# Patient Record
Sex: Female | Born: 1965 | Race: White | Hispanic: No | Marital: Married | Smoking: Never smoker
Health system: Southern US, Community
[De-identification: ages and names within clinical notes are randomized; demographics above are authoritative.]

## PROBLEM LIST (undated history)

## (undated) DIAGNOSIS — F32A Depression, unspecified: Secondary | ICD-10-CM

## (undated) DIAGNOSIS — F329 Major depressive disorder, single episode, unspecified: Secondary | ICD-10-CM

## (undated) DIAGNOSIS — F419 Anxiety disorder, unspecified: Secondary | ICD-10-CM

## (undated) DIAGNOSIS — I10 Essential (primary) hypertension: Secondary | ICD-10-CM

## (undated) HISTORY — PX: OTHER SURGICAL HISTORY: SHX169

## (undated) HISTORY — PX: TONSILLECTOMY: SUR1361

---
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
## 2006-08-06 ENCOUNTER — Ambulatory Visit: Payer: Self-pay | Admitting: Endocrinology

## 2008-06-16 ENCOUNTER — Ambulatory Visit: Payer: Self-pay | Admitting: Family Medicine

## 2008-10-15 ENCOUNTER — Ambulatory Visit: Payer: Self-pay | Admitting: Gastroenterology

## 2010-01-24 ENCOUNTER — Ambulatory Visit: Payer: Self-pay | Admitting: Family Medicine

## 2010-09-05 ENCOUNTER — Ambulatory Visit: Payer: Self-pay | Admitting: Family Medicine

## 2010-09-06 ENCOUNTER — Inpatient Hospital Stay: Payer: Self-pay | Admitting: Internal Medicine

## 2010-09-14 ENCOUNTER — Ambulatory Visit: Payer: Self-pay | Admitting: Family Medicine

## 2010-12-20 ENCOUNTER — Ambulatory Visit: Payer: Self-pay | Admitting: Family Medicine

## 2012-01-16 ENCOUNTER — Emergency Department: Payer: Self-pay | Admitting: Emergency Medicine

## 2012-01-16 LAB — CBC
MCHC: 34.7 g/dL (ref 32.0–36.0)
RBC: 4.86 10*6/uL (ref 3.80–5.20)
RDW: 13.6 % (ref 11.5–14.5)
WBC: 10.1 10*3/uL (ref 3.6–11.0)

## 2012-01-16 LAB — BASIC METABOLIC PANEL
Anion Gap: 7 (ref 7–16)
BUN: 12 mg/dL (ref 7–18)
Calcium, Total: 8.9 mg/dL (ref 8.5–10.1)
Creatinine: 0.64 mg/dL (ref 0.60–1.30)
EGFR (African American): >60
EGFR (Non-African Amer.): >60
Glucose: 90 mg/dL (ref 65–99)
Osmolality: 279 (ref 275–301)
Potassium: 3.8 mmol/L (ref 3.5–5.1)

## 2012-01-16 LAB — LIPASE, BLOOD: Lipase: 65 U/L — ABNORMAL LOW (ref 73–393)

## 2012-01-16 LAB — CK TOTAL AND CKMB (NOT AT ARMC)
CK, Total: 85 U/L (ref 21–215)
CK-MB: 0.9 ng/mL (ref 0.5–3.6)

## 2013-07-28 ENCOUNTER — Ambulatory Visit: Payer: Self-pay | Admitting: Family Medicine

## 2013-08-11 ENCOUNTER — Ambulatory Visit: Payer: Self-pay | Admitting: Family Medicine

## 2014-05-01 ENCOUNTER — Emergency Department
Admit: 2014-05-01 | Disposition: A | Discharge status: Home / Self Care | Payer: Self-pay | Admitting: Emergency Medicine

## 2014-05-01 LAB — BASIC METABOLIC PANEL
Anion Gap: 5 — ABNORMAL LOW (ref 7–16)
BUN: 18 mg/dL
CREATININE: 0.8 mg/dL
Calcium, Total: 8.7 mg/dL — ABNORMAL LOW
Chloride: 106 mmol/L
Co2: 26 mmol/L
EGFR (African American): >60
Glucose: 102 mg/dL — ABNORMAL HIGH
POTASSIUM: 3.6 mmol/L
SODIUM: 137 mmol/L

## 2014-05-01 LAB — CBC WITH DIFFERENTIAL/PLATELET
BASOS PCT: 1 %
Basophil #: 0.1 10*3/uL (ref 0.0–0.1)
Eosinophil #: 0.1 10*3/uL (ref 0.0–0.7)
Eosinophil %: 1.5 %
HCT: 40.6 % (ref 35.0–47.0)
HGB: 13.3 g/dL (ref 12.0–16.0)
LYMPHS ABS: 1 10*3/uL (ref 1.0–3.6)
Lymphocyte %: 15.7 %
MCH: 28.7 pg (ref 26.0–34.0)
MCHC: 32.7 g/dL (ref 32.0–36.0)
MCV: 88 fL (ref 80–100)
MONO ABS: 0.5 x10 3/mm (ref 0.2–0.9)
MONOS PCT: 7 %
NEUTROS ABS: 4.8 10*3/uL (ref 1.4–6.5)
Neutrophil %: 74.8 %
Platelet: 253 10*3/uL (ref 150–440)
RBC: 4.63 10*6/uL (ref 3.80–5.20)
RDW: 13.7 % (ref 11.5–14.5)
WBC: 6.4 10*3/uL (ref 3.6–11.0)

## 2014-05-06 LAB — CULTURE, BLOOD (SINGLE)

## 2015-04-05 ENCOUNTER — Ambulatory Visit (INDEPENDENT_AMBULATORY_CARE_PROVIDER_SITE_OTHER): Payer: 59 | Admitting: Podiatry

## 2015-04-05 ENCOUNTER — Encounter: Payer: Self-pay | Admitting: Podiatry

## 2015-04-05 ENCOUNTER — Ambulatory Visit (INDEPENDENT_AMBULATORY_CARE_PROVIDER_SITE_OTHER): Payer: 59

## 2015-04-05 VITALS — BP 143/81 | HR 76 | Resp 18

## 2015-04-05 DIAGNOSIS — M722 Plantar fascial fibromatosis: Secondary | ICD-10-CM | POA: Diagnosis not present

## 2015-04-05 DIAGNOSIS — M19072 Primary osteoarthritis, left ankle and foot: Secondary | ICD-10-CM | POA: Diagnosis not present

## 2015-04-05 DIAGNOSIS — M7730 Calcaneal spur, unspecified foot: Secondary | ICD-10-CM | POA: Insufficient documentation

## 2015-04-05 DIAGNOSIS — M7732 Calcaneal spur, left foot: Secondary | ICD-10-CM | POA: Diagnosis not present

## 2015-04-05 DIAGNOSIS — L600 Ingrowing nail: Secondary | ICD-10-CM | POA: Diagnosis not present

## 2015-04-05 DIAGNOSIS — R52 Pain, unspecified: Secondary | ICD-10-CM

## 2015-04-05 DIAGNOSIS — M19079 Primary osteoarthritis, unspecified ankle and foot: Secondary | ICD-10-CM | POA: Insufficient documentation

## 2015-04-05 MED ORDER — MELOXICAM 15 MG PO TABS: 15.0000 mg | ORAL_TABLET | Freq: Every day | ORAL | Status: AC

## 2015-04-05 NOTE — Progress Notes (Signed)
   Subjective:    Patient ID: Erika Kirk, female    DOB: [birth year], 50 y.o.   MRN: [id number]  HPI  Patient presents to the office for concerns of left heel pain, on the bottom which has been ongoing on for about 2 weeks. It is worse in the morning when she first gets up. It is also bad if she sits down for some time and gets back up to walk. After she walks the pain does ease up. She also gets pain if she brings her foot up (shows me an ankle dorsiflexion motion). This started after wearing flat shoes. No tingling or numbness. No injury or trauma. No swelling or redness. No other complaints at this time.   Review of Systems  All other systems reviewed and are negative.      Objective:   Physical Exam General: AAO x3, NAD  Dermatological: Skin is warm, dry and supple bilateral.She speaks we have the right hallux toenail removed and she has small spicules the nail within both medial and lateral nail borders. No open lesions or pre-ulcer lesions identified this time.   Vascular: Dorsalis Pedis artery and Posterior Tibial artery pedal pulses are 2/4 bilateral with immedate capillary fill time. Pedal hair growth present. No varicosities and no lower extremity edema present bilateral. There is no pain with calf compression, swelling, warmth, erythema.   Neruologic: Grossly intact via light touch bilateral. Vibratory intact via tuning fork bilateral. Protective threshold with Semmes Wienstein monofilament intact to all pedal sites bilateral. Patellar and Achilles deep tendon reflexes 2+ bilateral. No Babinski or clonus noted bilateral.   Musculoskeletal: Tenderness to palpation along the plantar medial tubercle of the calcaneus at the insertion of plantar fascia on the left foot. There is no pain along the course of the plantar fascia within the arch of the foot. Plantar fascia appears to be intact. There is no pain with lateral compression of the calcaneus or pain with vibratory  sensation. There is no pain along the course or insertion of the achilles tendon. No other areas of tenderness to bilateral lower extremities. HAV and hammertoes are present.  Gait: Unassisted, Nonantalgic.      Assessment & Plan:  50 year old female left heel pain, likely plantar fasciitis, heel spur syndrome. -Treatment options discussed including all alternatives, risks, and complications -X-rays were obtained and reviewed with the patient. Female spurring is present. 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 -Etiology of symptoms were discussed -Patient elects to proceed with steroid injection into the left heel. Under sterile skin preparation, a total of 2.5cc of kenalog 10, 0.5% Marcaine plain, and 2% lidocaine plain were infiltrated into the symptomatic area without complication. A band-aid was applied. Patient tolerated the injection well without complication. Post-injection care with discussed with the patient. Discussed with the patient to ice the area over the next couple of days to help prevent a steroid flare.  -Prescribed mobic. Discussed side effects of the medication and directed to stop if any are to occur and call the office.  -Stretching exercises daily. -Ice to the area -Discussed supportive shoe gear. -Plantar fascial brace. -She also like to have the spicules the nail removed peripherally. Discussed AP nail procedure next appointment if she were to proceed with.  -Follow-up in 3 weeks or sooner if any problems arise. In the meantime, encouraged to call the office with any questions, concerns, change in symptoms.   Ovid CurdMatthew Wagoner, DPM

## 2015-04-05 NOTE — Patient Instructions (Signed)

## 2015-04-26 ENCOUNTER — Encounter: Payer: Self-pay | Admitting: Podiatry

## 2015-04-26 ENCOUNTER — Ambulatory Visit (INDEPENDENT_AMBULATORY_CARE_PROVIDER_SITE_OTHER): Payer: 59 | Admitting: Podiatry

## 2015-04-26 VITALS — BP 148/80 | HR 70 | Resp 18

## 2015-04-26 DIAGNOSIS — M722 Plantar fascial fibromatosis: Secondary | ICD-10-CM

## 2015-04-26 DIAGNOSIS — L600 Ingrowing nail: Secondary | ICD-10-CM | POA: Diagnosis not present

## 2015-04-26 NOTE — Progress Notes (Addendum)
Patient ID: Erika Kirk, female   DOB: Apr 21, 1965, 50 y.o.   MRN: [id number]  Subjective: 50 year old female presents the also for follow-up with vibration left heel pain. She says the injection did help some however the pain has come back. She has been stretching icing as much as she can. She also gets intermittent numbness to the foot and leg however not currently experiencing this.  She also had to proceed with partial nail avulsions of the right big toenail to remove the spicule the nail from the nail border. Denies any redness or drainage or any swelling.  Denies any systemic complaints such as fevers, chills, nausea, vomiting. No acute changes since last appointment, and no other complaints at this time.   Objective: AAO x3, NA; presents wearing flat sandals  DP/PT pulses palpable bilaterally, CRT less than 3 seconds Protective sensation intact with Simms Weinstein monofilament On the right hallux or spicules of nail present in both the medial and lateral nail border tenderness to palpation. There is no surrounding edema, erythema, drainage or pus. Tenderness to palpation along the plantar medial tubercle of the calcaneus at the insertion of plantar fascia on the left foot. There is no pain along the course of the plantar fascia within the arch of the foot. Plantar fascia appears to be intact. There is no pain with lateral compression of the calcaneus or pain with vibratory sensation. There is no pain along the course or insertion of the achilles tendon. No other areas of pinpoint bony tenderness or pain with vibratory sensation. MMT 5/5, ROM WNL. No edema, erythema, increase in warmth to bilateral lower extremities.  No open lesions or pre-ulcerative lesions.  No pain with calf compression, swelling, warmth, erythema  Assessment: Left foot plantar fasciitis, right foot ingrown toenail hallux  Plan: -All treatment options discussed with the patient including all alternatives,  risks, complications.  -In regards to the plantar fasciitis I discussed a second steroid injection she wishes to proceed. Sterile conditions a mixture of Kenalog 10 and lobe Lamisil it was infiltrated into the area of maximal tenderness without complications. Post injection care discussed. Also continues stretching, icing, anti-inflammatories. Discussed supportive shoe gear and custom orthotics. -At this time, the patient is requesting partial nail removal with chemical matricectomy to the symptomatic portion of the nail. Risks and complications were discussed with the patient for which they understand and  verbally consent to the procedure. Under sterile conditions a total of 3 mL of a mixture of 2% lidocaine plain and 0.5% Marcaine plain was infiltrated in a hallux block fashion. Once anesthetized, the skin was prepped in sterile fashion. A tourniquet was then applied. Next the medial and lateral aspect of hallux nail border was then sharply excised making sure to remove the entire offending nail border. Once the nails were ensured to be removed area was debrided and the underlying skin was intact. There is no purulence identified in the procedure. Next phenol was then applied under standard conditions and copiously irrigated. Silvadene was applied. A dry sterile dressing was applied. After application of the dressing the tourniquet was removed and there is found to be an immediate capillary refill time to the digit. The patient tolerated the procedure well any complications. Post procedure instructions were discussed the patient for which he verbally understood. Follow-up in 2 weeks for nail check or sooner if any problems are to arise. Discussed signs/symptoms of infection and directed to call the office immediately should any occur or go directly to the emergency  room. In the meantime, encouraged to call the office with any questions, concerns, changes symptoms. -Discussed etiologies of left foot and leg  numbness. She has no back problems. We'll consider graft conduction test of symptoms persist. She is currently staying these is intermittent in nature and currently on experiencing symptoms. Will monitor.  -Patient encouraged to call the office with any questions, concerns, change in symptoms.   Erika Kirk, DPM

## 2015-04-26 NOTE — Patient Instructions (Signed)

## 2015-05-03 ENCOUNTER — Telehealth: Payer: Self-pay | Admitting: *Deleted

## 2015-05-03 NOTE — Telephone Encounter (Signed)
Patient called and left a message on the nurse line today at 9:39 am and sees Dr Ardelle AntonWagoner and was to see Dr Ardelle AntonWagoner next week but was wanting to see if Dr Ardelle AntonWagoner would RX prednisone a week early and phone number is [phone]. Misty StanleyLisa

## 2015-05-03 NOTE — Telephone Encounter (Addendum)
Pt states Dr. Ardelle AntonWagoner and she had discuss the possibility of steroid dose pack at last visit, but decided to take an steroid injection.  Pt states the steroid injection is not working and she has an appt next week, but would like to know if she can have the steroid dose pack prior to the appt.  I told pt to continue the Mobic, until I called again, that I would message Dr. Ardelle AntonWagoner.  I encouraged pt to continue icing 10-15 minutes 3-4 times a day and to rest.  05/05/2015-left message for pt to call for orders from Dr. Ardelle AntonWagoner. Pt returned my call and states her toenail is perfect.  I told pt Dr. Ardelle AntonWagoner would order the steroid pack and not to take the Meloxicam until she had completed the steroid dose pack. Pt states understanding.

## 2015-05-04 NOTE — Telephone Encounter (Signed)
As long as the toenail is doing good can start a medrol dose pack 4mg 

## 2015-05-05 MED ORDER — METHYLPREDNISOLONE 4 MG PO TBPK: ORAL_TABLET | ORAL | Status: DC

## 2015-05-12 ENCOUNTER — Encounter: Payer: Self-pay | Admitting: Podiatry

## 2015-05-12 ENCOUNTER — Ambulatory Visit (INDEPENDENT_AMBULATORY_CARE_PROVIDER_SITE_OTHER): Payer: 59 | Admitting: Podiatry

## 2015-05-12 VITALS — BP 139/84 | HR 80 | Resp 18

## 2015-05-12 DIAGNOSIS — M722 Plantar fascial fibromatosis: Secondary | ICD-10-CM | POA: Diagnosis not present

## 2015-05-12 MED ORDER — METHYLPREDNISOLONE 4 MG PO TBPK: ORAL_TABLET | ORAL | Status: AC

## 2015-05-16 NOTE — Progress Notes (Signed)
Patient ID: Osie BondNancy Terveen Zinni, female   DOB: [birth year], 50 y.o.   MRN: [id number]  Subjective: 50 year old female presents the office in follow-up with vibration a left heel pain. She states that she still same painter heel. The pain may be somewhat improved however overall remains about the same. She wishes to any further steroid injection. She is asking to do another round of a Medrol Dosepak as she typically needs 14 days of this. She denies any swelling or redness. No tingling or numbness. She states of the right side is doing well she's having no issues of the toenail is healed. She does not wish to take off her shoes or socks on the right side. Denies any systemic complaints such as fevers, chills, nausea, vomiting. No acute changes since last appointment, and no other complaints at this time.   Objective: AAO x3, NAD DP/PT pulses palpable bilaterally, CRT less than 3 seconds Protective sensation intact with Simms Weinstein monofilament On the left foot there is continuation tenderness to palpation on the plantar medial tubercle of the calcaneus at the insertion of the plantar fascia. There is also some discomfort along the medial band of the plantar fascia within the arch of the foot. Plantar fascia appears to be intact. No pain with medial to lateral compression of the calcaneus. No areas of pinpoint bony tenderness or pain with vibratory sensation. MMT 5/5, ROM WNL. No edema, erythema, increase in warmth to bilateral lower extremities.  No open lesions or pre-ulcerative lesions.  No pain with calf compression, swelling, warmth, erythema  Assessment: Continuation of plantar fascial pain left foot and arch pain.  Plan: -All treatment options discussed with the patient including all alternatives, risks, complications.  -She wishes to hold off any further steroid injection. Prescribed one more round of Medrol Dosepak. Plantar fascial taping was applied. Continue stretching, icing as well  as plantar fascial brace. Continue orthotics and supportive shoes. -Follow-up in 3-4 weeks or sooner if any problems arise. In the meantime, encouraged to call the office with any questions, concerns, change in symptoms.   Ovid CurdMatthew Kamelia Lampkins, DPM

## 2015-06-02 ENCOUNTER — Ambulatory Visit: Payer: 59 | Admitting: Podiatry

## 2018-06-27 ENCOUNTER — Other Ambulatory Visit: Payer: Self-pay

## 2018-06-27 ENCOUNTER — Other Ambulatory Visit
Admission: RE | Admit: 2018-06-27 | Discharge: 2018-06-27 | Disposition: A | Discharge status: Home / Self Care | Payer: 59 | Source: Ambulatory Visit | Attending: Internal Medicine | Admitting: Internal Medicine

## 2018-06-27 DIAGNOSIS — Z1159 Encounter for screening for other viral diseases: Secondary | ICD-10-CM | POA: Diagnosis not present

## 2018-06-27 DIAGNOSIS — Z01812 Encounter for preprocedural laboratory examination: Secondary | ICD-10-CM | POA: Insufficient documentation

## 2018-06-28 LAB — NOVEL CORONAVIRUS, NAA (HOSP ORDER, SEND-OUT TO REF LAB; TAT 18-24 HRS): SARS-CoV-2, NAA: NOT DETECTED

## 2018-07-02 ENCOUNTER — Other Ambulatory Visit: Payer: Self-pay

## 2018-07-02 ENCOUNTER — Ambulatory Visit: Payer: 59 | Admitting: Anesthesiology

## 2018-07-02 ENCOUNTER — Encounter: Payer: Self-pay | Admitting: *Deleted

## 2018-07-02 ENCOUNTER — Ambulatory Visit
Admission: RE | Admit: 2018-07-02 | Discharge: 2018-07-02 | Disposition: A | Discharge status: Home / Self Care | Payer: 59 | Attending: Internal Medicine | Admitting: Internal Medicine

## 2018-07-02 ENCOUNTER — Encounter
Admission: RE | Disposition: A | Discharge status: Home / Self Care | Payer: Self-pay | Source: Home / Self Care | Attending: Internal Medicine

## 2018-07-02 DIAGNOSIS — I10 Essential (primary) hypertension: Secondary | ICD-10-CM | POA: Insufficient documentation

## 2018-07-02 DIAGNOSIS — Z1211 Encounter for screening for malignant neoplasm of colon: Secondary | ICD-10-CM | POA: Insufficient documentation

## 2018-07-02 DIAGNOSIS — Z791 Long term (current) use of non-steroidal anti-inflammatories (NSAID): Secondary | ICD-10-CM | POA: Insufficient documentation

## 2018-07-02 DIAGNOSIS — M1990 Unspecified osteoarthritis, unspecified site: Secondary | ICD-10-CM | POA: Insufficient documentation

## 2018-07-02 DIAGNOSIS — Z79899 Other long term (current) drug therapy: Secondary | ICD-10-CM | POA: Diagnosis not present

## 2018-07-02 DIAGNOSIS — K5730 Diverticulosis of large intestine without perforation or abscess without bleeding: Secondary | ICD-10-CM | POA: Diagnosis not present

## 2018-07-02 DIAGNOSIS — K642 Third degree hemorrhoids: Secondary | ICD-10-CM | POA: Diagnosis not present

## 2018-07-02 DIAGNOSIS — Z6841 Body Mass Index (BMI) 40.0 and over, adult: Secondary | ICD-10-CM | POA: Diagnosis not present

## 2018-07-02 HISTORY — DX: Essential (primary) hypertension: I10

## 2018-07-02 HISTORY — PX: COLONOSCOPY WITH PROPOFOL: SHX5780

## 2018-07-02 HISTORY — DX: Depression, unspecified: F32.A

## 2018-07-02 HISTORY — DX: Anxiety disorder, unspecified: F41.9

## 2018-07-02 LAB — POCT PREGNANCY, URINE: Preg Test, Ur: NEGATIVE

## 2018-07-02 SURGERY — COLONOSCOPY WITH PROPOFOL
Anesthesia: General

## 2018-07-02 MED ORDER — PHENYLEPHRINE HCL (PRESSORS) 10 MG/ML IV SOLN
INTRAVENOUS | Status: DC | PRN
  Administered 2018-07-02: 100 ug via INTRAVENOUS

## 2018-07-02 MED ORDER — LIDOCAINE HCL (PF) 2 % IJ SOLN
INTRAMUSCULAR | Status: AC
  Filled 2018-07-02: qty 10

## 2018-07-02 MED ORDER — PROPOFOL 500 MG/50ML IV EMUL
INTRAVENOUS | Status: DC | PRN
  Administered 2018-07-02: 175 ug/kg/min via INTRAVENOUS

## 2018-07-02 MED ORDER — PROPOFOL 10 MG/ML IV BOLUS
INTRAVENOUS | Status: AC
  Filled 2018-07-02: qty 20

## 2018-07-02 MED ORDER — PROPOFOL 500 MG/50ML IV EMUL
INTRAVENOUS | Status: AC
  Filled 2018-07-02: qty 50

## 2018-07-02 MED ORDER — SODIUM CHLORIDE 0.9 % IV SOLN
INTRAVENOUS | Status: DC
  Administered 2018-07-02: 1000 mL via INTRAVENOUS

## 2018-07-02 MED ORDER — PROPOFOL 10 MG/ML IV BOLUS
INTRAVENOUS | Status: DC | PRN
  Administered 2018-07-02: 100 mg via INTRAVENOUS

## 2018-07-02 NOTE — Anesthesia Post-op Follow-up Note (Signed)
Anesthesia QCDR form completed.        

## 2018-07-02 NOTE — Anesthesia Preprocedure Evaluation (Signed)
Anesthesia Evaluation  Patient identified by MRN, date of birth, ID band Patient awake    Reviewed: Allergy & Precautions, NPO status , Patient's Chart, lab work & pertinent test results  History of Anesthesia Complications Negative for: history of anesthetic complications  Airway Mallampati: III  TM Distance: >3 FB Neck ROM: Full    Dental no notable dental hx.    Pulmonary neg pulmonary ROS, neg sleep apnea, neg COPD,    breath sounds clear to auscultation- rhonchi (-) wheezing      Cardiovascular hypertension, Pt. on medications (-) CAD, (-) Past MI, (-) Cardiac Stents and (-) CABG  Rhythm:Regular Rate:Normal - Systolic murmurs and - Diastolic murmurs    Neuro/Psych neg Seizures PSYCHIATRIC DISORDERS Anxiety Depression negative neurological ROS     GI/Hepatic negative GI ROS, Neg liver ROS,   Endo/Other  negative endocrine ROSneg diabetes  Renal/GU negative Renal ROS     Musculoskeletal  (+) Arthritis ,   Abdominal (+) + obese,   Peds  Hematology negative hematology ROS (+)   Anesthesia Other Findings Past Medical History: No date: Anxiety No date: Depression No date: Hypertension   Reproductive/Obstetrics                             Anesthesia Physical Anesthesia Plan  ASA: II  Anesthesia Plan: General   Post-op Pain Management:    Induction: Intravenous  PONV Risk Score and Plan: 1 and Propofol infusion  Airway Management Planned: Natural Airway  Additional Equipment:   Intra-op Plan:   Post-operative Plan:   Informed Consent: I have reviewed the patients History and Physical, chart, labs and discussed the procedure including the risks, benefits and alternatives for the proposed anesthesia with the patient or authorized representative who has indicated his/her understanding and acceptance.     Dental advisory given  Plan Discussed with: CRNA and  Anesthesiologist  Anesthesia Plan Comments:         Anesthesia Quick Evaluation

## 2018-07-02 NOTE — Op Note (Signed)
[facility], [facility] Gastroenterology Patient Name: Erika Kirk Procedure Date: 07/02/2018 12:57 PM MRN: [id number] Account #: [id number] Date of Birth: 1965-10-07 Admit Type: Outpatient Age: [age] [facility] 3 Gender: Female Note Status: Finalized Procedure:            Colonoscopy Indications:          Screening for colorectal malignant neoplasm Providers:            Boykin Nearingeodoro K. Toledo MD, MD Medicines:            Propofol per Anesthesia Complications:        No immediate complications. Procedure:            Pre-Anesthesia Assessment:                       - The risks and benefits of the procedure and the                        sedation options and risks were discussed with the                        patient. All questions were answered and informed                        consent was obtained.                       - Patient identification and proposed procedure were                        verified prior to the procedure by the nurse. The                        procedure was verified in the procedure room.                       - ASA Grade Assessment: III - A patient with severe                        systemic disease.                       - After reviewing the risks and benefits, the patient                        was deemed in satisfactory condition to undergo the                        procedure.                       After obtaining informed consent, the colonoscope was                        passed under direct vision. Throughout the procedure,                        the patient's blood pressure, pulse, and oxygen                        saturations were monitored continuously. The  Colonoscope was introduced through the anus and                        advanced to the the cecum, identified by appendiceal                        orifice and ileocecal valve. The Colonoscope was                        introduced through the and advanced to  the. The                        colonoscopy was performed without difficulty. The                        patient tolerated the procedure well. The quality of                        the bowel preparation was good. The ileocecal valve,                        appendiceal orifice, and rectum were photographed. Findings:      The perianal exam findings include internal hemorrhoids that prolapse       with straining, but require manual replacement into the anal canal       (Grade III).      The digital rectal exam was normal. Pertinent negatives include normal       sphincter tone.      A few small-mouthed diverticula were found in the entire colon.      Non-bleeding internal hemorrhoids were found during retroflexion. The       hemorrhoids were Grade I (internal hemorrhoids that do not prolapse).      The exam was otherwise without abnormality. Impression:           - Internal hemorrhoids that prolapse with straining,                        but require manual replacement into the anal canal                        (Grade III) found on perianal exam.                       - Diverticulosis in the entire examined colon.                       - Non-bleeding internal hemorrhoids.                       - The examination was otherwise normal.                       - No specimens collected. Recommendation:       - Patient has a contact number available for                        emergencies. The signs and symptoms of potential                        delayed complications were discussed with the patient.  Return to normal activities tomorrow. Written discharge                        instructions were provided to the patient.                       - High fiber diet.                       - Continue present medications.                       - Repeat colonoscopy in 10 years for screening purposes.                       - Refer to a colo-rectal surgeon if symptoms persist.                        - hemorrhoidectomy                       - Return to GI office PRN.                       - The findings and recommendations were discussed with                        the patient. Procedure Code(s):    --- Professional ---                       [id number], Colorectal cancer screening; colonoscopy on                        individual not meeting criteria for high risk Diagnosis Code(s):    --- Professional ---                       K57.30, Diverticulosis of large intestine without                        perforation or abscess without bleeding                       K64.2, Third degree hemorrhoids                       Z12.11, Encounter for screening for malignant neoplasm                        of colon CPT copyright 2019 American Medical Association. All rights reserved. The codes documented in this report are preliminary and upon coder review may  be revised to meet current compliance requirements. Stanton Kidneyeodoro K Toledo MD, MD 07/02/2018 1:30:01 PM This report has been signed electronically. Number of Addenda: 0 Note Initiated On: 07/02/2018 12:57 PM Scope Withdrawal Time: 0 hours 6 minutes 6 seconds  Total Procedure Duration: 0 hours 9 minutes 35 seconds  Estimated Blood Loss: Estimated blood loss: none.      [facility]

## 2018-07-02 NOTE — Interval H&P Note (Signed)
History and Physical Interval Note:  07/02/2018 1:02 PM  Erika Kirk  has presented today for surgery, with the diagnosis of SCREENING.  The various methods of treatment have been discussed with the patient and family. After consideration of risks, benefits and other options for treatment, the patient has consented to  Procedure(s): COLONOSCOPY WITH PROPOFOL (N/A) as a surgical intervention.  The patient's history has been reviewed, patient examined, no change in status, stable for surgery.  I have reviewed the patient's chart and labs.  Questions were answered to the patient's satisfaction.     [place], [place]

## 2018-07-02 NOTE — Anesthesia Postprocedure Evaluation (Signed)
Anesthesia Post Note  Patient: Erika Kirk  Procedure(s) Performed: COLONOSCOPY WITH PROPOFOL (N/A )  Patient location during evaluation: Endoscopy Anesthesia Type: General Level of consciousness: awake and alert and oriented Pain management: pain level controlled Vital Signs Assessment: post-procedure vital signs reviewed and stable Respiratory status: spontaneous breathing, nonlabored ventilation and respiratory function stable Cardiovascular status: blood pressure returned to baseline and stable Postop Assessment: no signs of nausea or vomiting Anesthetic complications: no     Last Vitals:  Vitals:   07/02/18 1339 07/02/18 1349  BP: 104/67 122/79  Pulse: 74 81  Resp: 18 18  Temp:    SpO2: 95% 98%    Last Pain:  Vitals:   07/02/18 1349  TempSrc:   PainSc: 0-No pain                 Henlee Donovan

## 2018-07-02 NOTE — Interval H&P Note (Signed)
History and Physical Interval Note:  07/02/2018 12:30 PM  Erika Kirk  has presented today for surgery, with the diagnosis of SCREENING.  The various methods of treatment have been discussed with the patient and family. After consideration of risks, benefits and other options for treatment, the patient has consented to  Procedure(s): COLONOSCOPY WITH PROPOFOL (N/A) as a surgical intervention.  The patient's history has been reviewed, patient examined, no change in status, stable for surgery.  I have reviewed the patient's chart and labs.  Questions were answered to the patient's satisfaction.     [place], [place]

## 2018-07-02 NOTE — Transfer of Care (Signed)
Immediate Anesthesia Transfer of Care Note  Patient: Erika Kirk  Procedure(s) Performed: COLONOSCOPY WITH PROPOFOL (N/A )  Patient Location: PACU  Anesthesia Type:General  Level of Consciousness: awake and drowsy  Airway & Oxygen Therapy: Patient Spontanous Breathing and Patient connected to nasal cannula oxygen  Post-op Assessment: Report given to RN and Post -op Vital signs reviewed and stable  Post vital signs: Reviewed and stable  Last Vitals:  Vitals Value Taken Time  BP 86/49 07/02/18 1330  Temp    Pulse 73 07/02/18 1330  Resp 20 07/02/18 1330  SpO2 93 % 07/02/18 1330  Vitals shown include unvalidated device data.  Last Pain:  Vitals:   07/02/18 1329  TempSrc: (P) Tympanic      Patients Stated Pain Goal: 0 (83/38/25 0539)  Complications: No apparent anesthesia complications

## 2018-07-02 NOTE — H&P (Signed)
Outpatient short stay form Pre-procedure 07/02/2018 10:50 AM Erika Kirk, M.D.  Primary Physician: [place].  Reason for visit: Colon cancer screening  History of present illness:  Patient with hx of morbid obesity and anal fissure presents for colonoscopy. Has occasional mild constipation.   No current facility-administered medications for this encounter.   Current Outpatient Medications:  .  atorvastatin (LIPITOR) 20 MG tablet, Take 20 mg by mouth daily., Disp: , Rfl:  .  cetirizine (ZYRTEC) 10 MG tablet, Take 10 mg by mouth daily., Disp: , Rfl:  .  fluticasone (VERAMYST) 27.5 MCG/SPRAY nasal spray, Place 2 sprays into the nose daily., Disp: , Rfl:  .  lisinopril-hydrochlorothiazide (ZESTORETIC) 20-25 MG tablet, Take 1 tablet by mouth daily., Disp: , Rfl:  .  meloxicam (MOBIC) 15 MG tablet, Take 1 tablet (15 mg total) by mouth daily., Disp: 30 tablet, Rfl: 2 .  methylPREDNISolone (MEDROL) 4 MG TBPK tablet, Take in a tapering dose as directed., Disp: 21 tablet, Rfl: 0  No medications prior to admission.     No Known Allergies   No past medical history on file.  Review of systems:  Otherwise negative.    Physical Exam  Gen: Alert, oriented. Appears stated age.  HEENT: [place]/AT. PERRLA. Lungs: CTA, no wheezes. CV: RR nl S1, S2. Abd: soft, benign, no masses. BS+ Ext: No edema. Pulses 2+    Planned procedures: Proceed with colonoscopy. The patient understands the nature of the planned procedure, indications, risks, alternatives and potential complications including but not limited to bleeding, infection, perforation, damage to internal organs and possible oversedation/side effects from anesthesia. The patient agrees and gives consent to proceed.  Please refer to procedure notes for findings, recommendations and patient disposition/instructions.     Erika Kirk, M.D. Gastroenterology 07/02/2018  10:50 AM

## 2019-04-16 ENCOUNTER — Inpatient Hospital Stay
Admission: RE | Admit: 2019-04-16 | Discharge: 2019-04-16 | Disposition: A | Discharge status: Home / Self Care | Payer: Self-pay | Source: Ambulatory Visit | Attending: *Deleted | Admitting: *Deleted

## 2019-04-16 ENCOUNTER — Other Ambulatory Visit: Payer: Self-pay | Admitting: Medical Oncology

## 2019-04-16 ENCOUNTER — Other Ambulatory Visit: Payer: Self-pay | Admitting: *Deleted

## 2019-04-16 DIAGNOSIS — Z1231 Encounter for screening mammogram for malignant neoplasm of breast: Secondary | ICD-10-CM

## 2019-06-01 ENCOUNTER — Ambulatory Visit
Admission: RE | Admit: 2019-06-01 | Discharge: 2019-06-01 | Disposition: A | Discharge status: Home / Self Care | Payer: BC Managed Care – PPO | Source: Ambulatory Visit | Attending: Medical Oncology | Admitting: Medical Oncology

## 2019-06-01 DIAGNOSIS — Z1231 Encounter for screening mammogram for malignant neoplasm of breast: Secondary | ICD-10-CM | POA: Diagnosis not present

## 2020-07-27 IMAGING — MG DIGITAL SCREENING BILAT W/ TOMO W/ CAD
6 of 10 series · 6 of 30 positions shown · non-contrast
Comparison: Previous exam(s).

CLINICAL DATA: Screening.

EXAM:
DIGITAL SCREENING BILATERAL MAMMOGRAM WITH TOMO AND CAD

[L CC synth-2D]
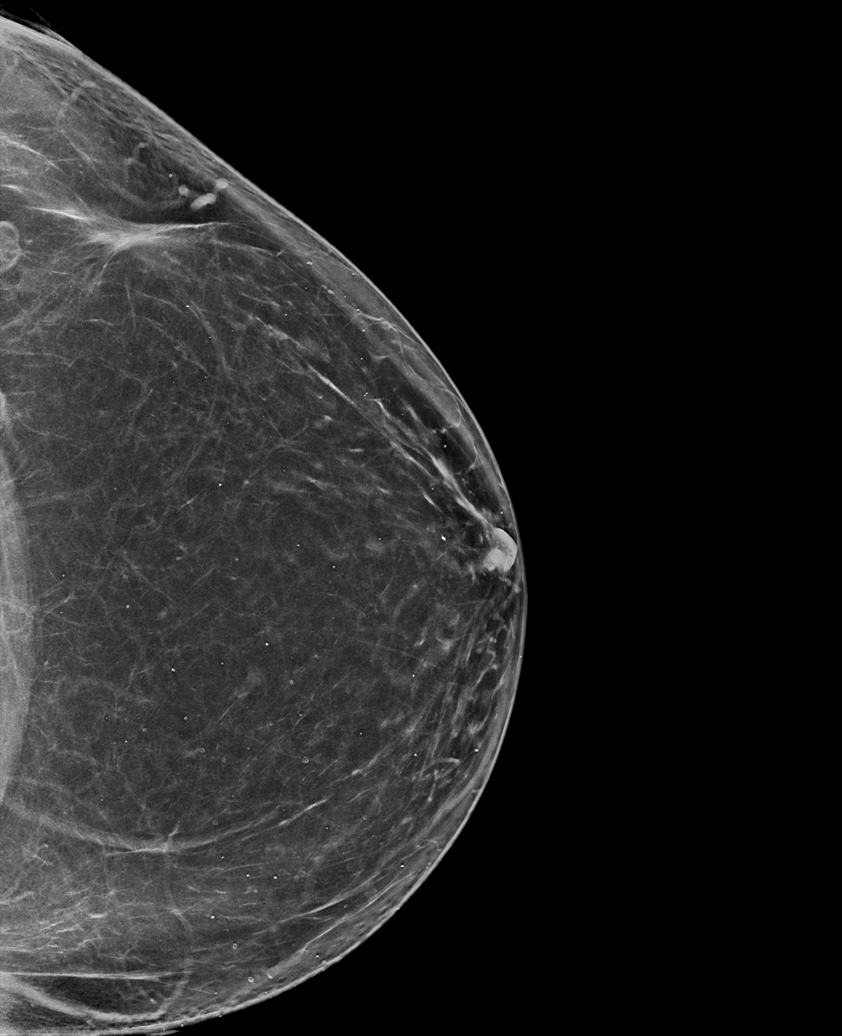

[L MLO synth-2D]
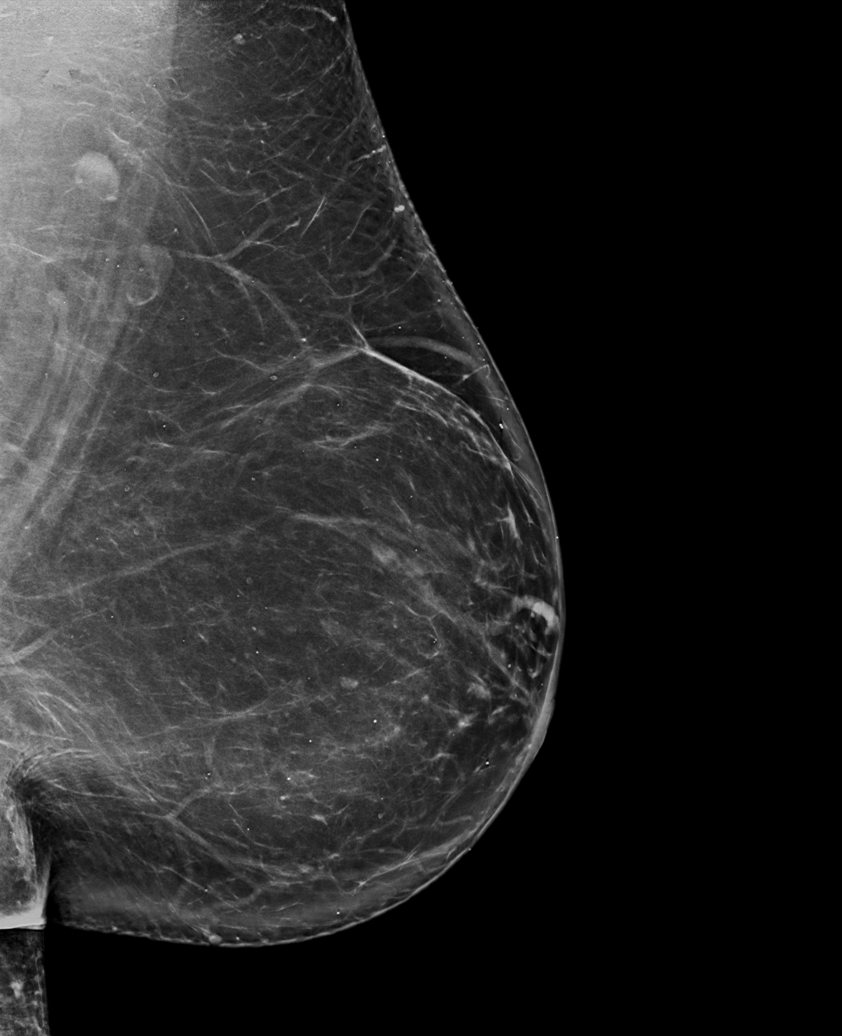

[R CC synth-2D]
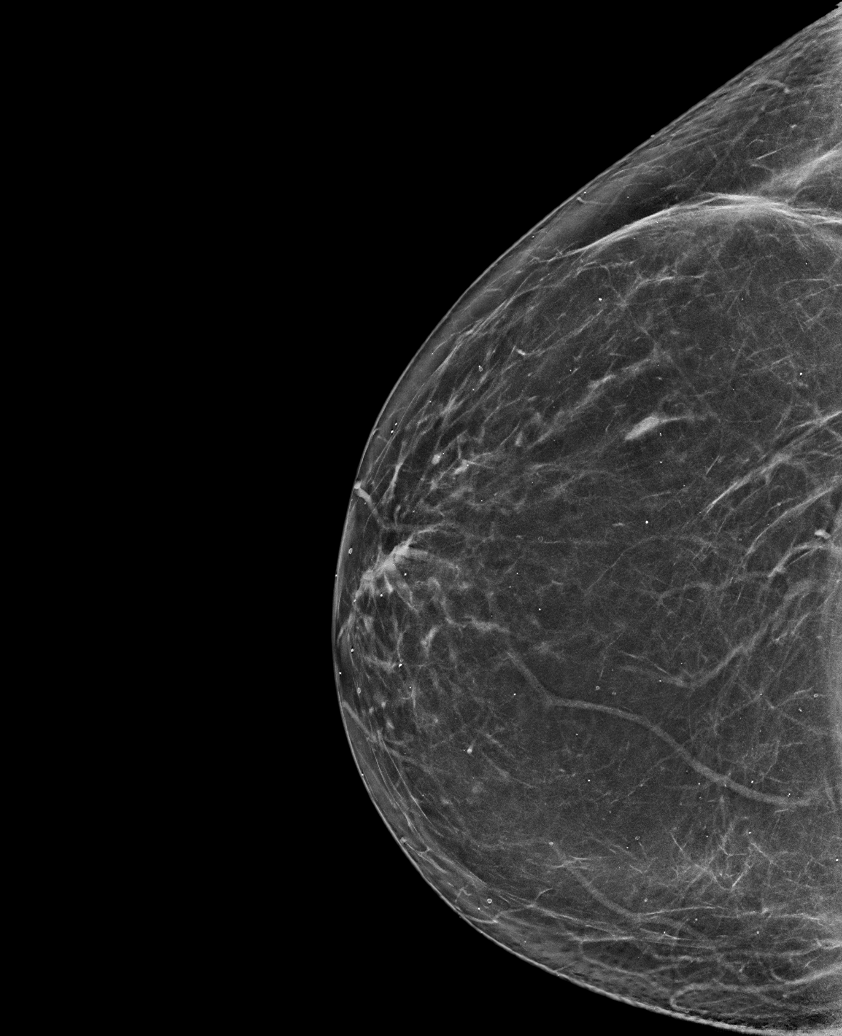

[R MLO synth-2D]
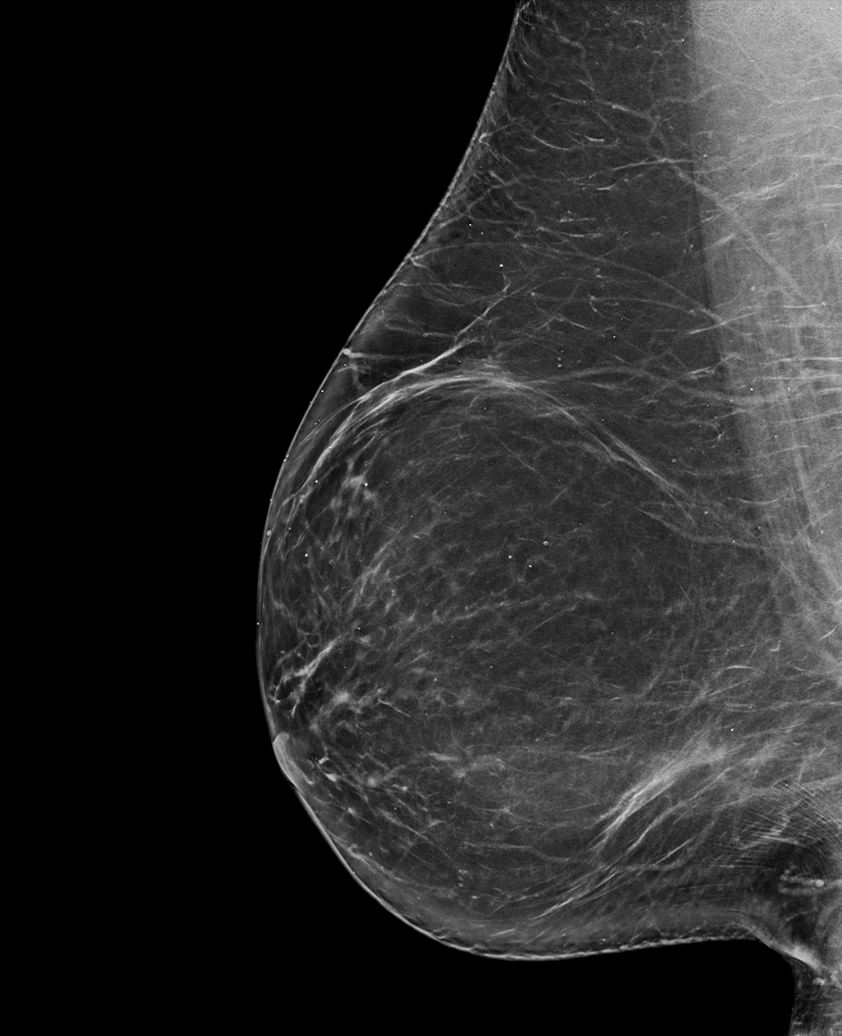

[L CV synth-2D]
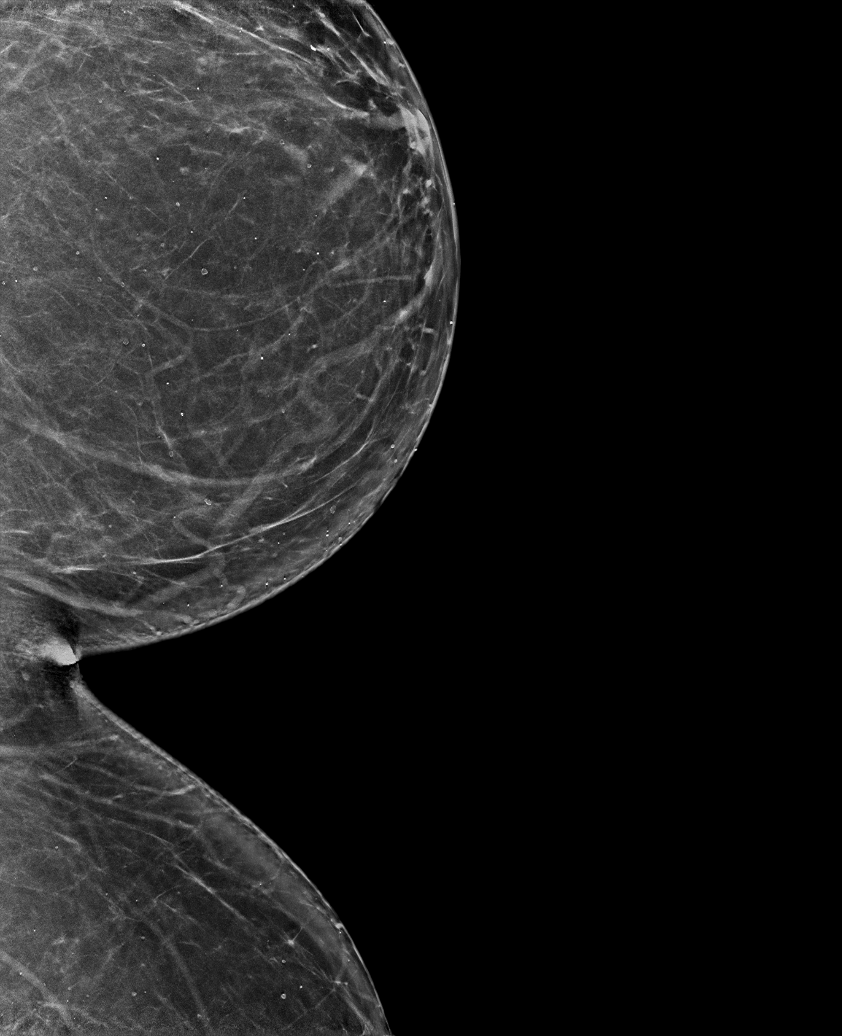

[R MLO tomo · tomo slice 44/87.0]
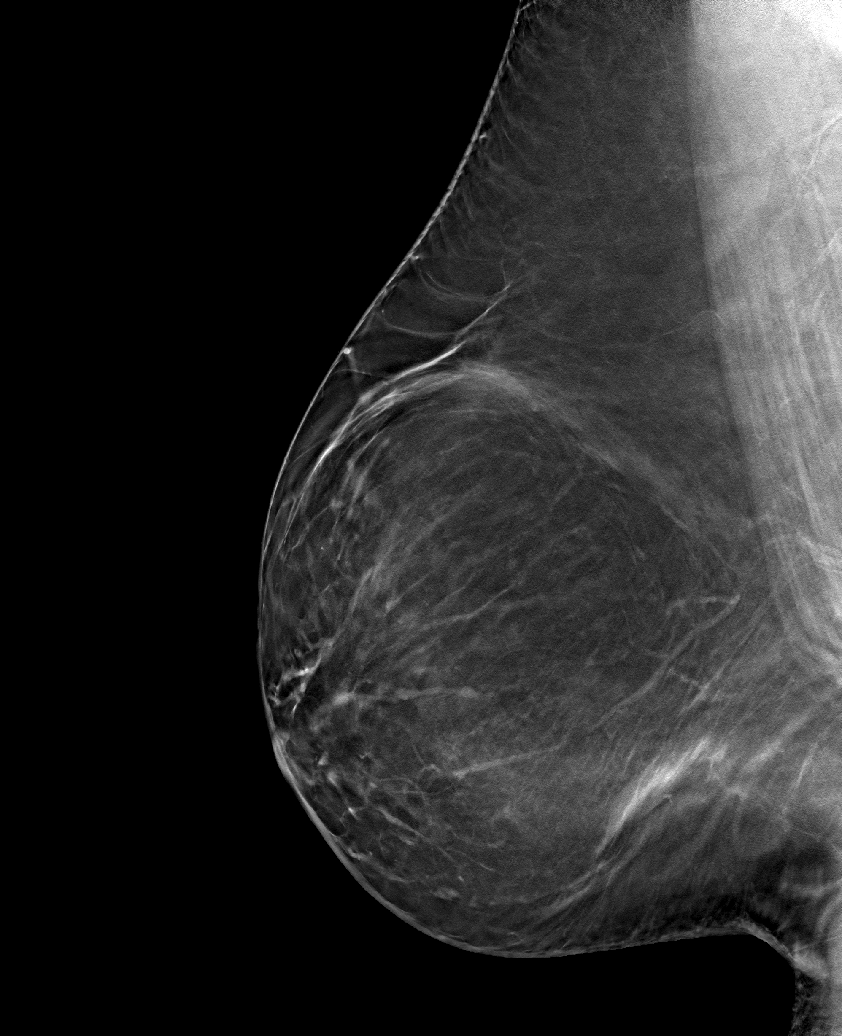

[6 of 30 positions shown; findings below may reference images not displayed]

ACR Breast Density Category b: There are scattered areas of
fibroglandular density.
FINDINGS: There are no findings suspicious for malignancy. Images were
processed with CAD.
IMPRESSION: No mammographic evidence of malignancy. A result letter of this
screening mammogram will be mailed directly to the patient.

RECOMMENDATION:
Screening mammogram in one year. (Code:[id number])

BI-RADS CATEGORY  1: Negative.

## 2022-06-04 ENCOUNTER — Other Ambulatory Visit: Payer: Self-pay

## 2022-06-04 DIAGNOSIS — Z13820 Encounter for screening for osteoporosis: Secondary | ICD-10-CM
